# Patient Record
Sex: Female | Born: 1990 | Race: White | Hispanic: No | Smoking: Current every day smoker
Health system: Southern US, Community
[De-identification: ages and names within clinical notes are randomized; demographics above are authoritative.]

## PROBLEM LIST (undated history)

## (undated) DIAGNOSIS — F419 Anxiety disorder, unspecified: Secondary | ICD-10-CM

## (undated) DIAGNOSIS — F319 Bipolar disorder, unspecified: Secondary | ICD-10-CM

## (undated) DIAGNOSIS — F909 Attention-deficit hyperactivity disorder, unspecified type: Secondary | ICD-10-CM

## (undated) DIAGNOSIS — F988 Other specified behavioral and emotional disorders with onset usually occurring in childhood and adolescence: Secondary | ICD-10-CM

## (undated) DIAGNOSIS — G43909 Migraine, unspecified, not intractable, without status migrainosus: Secondary | ICD-10-CM

## (undated) HISTORY — DX: Bipolar disorder, unspecified: F31.9

## (undated) HISTORY — DX: Other specified behavioral and emotional disorders with onset usually occurring in childhood and adolescence: F98.8

## (undated) HISTORY — PX: TUBAL LIGATION: SHX77

## (undated) HISTORY — DX: Migraine, unspecified, not intractable, without status migrainosus: G43.909

## (undated) HISTORY — DX: Attention-deficit hyperactivity disorder, unspecified type: F90.9

## (undated) HISTORY — DX: Anxiety disorder, unspecified: F41.9

## (undated) HISTORY — PX: OTHER SURGICAL HISTORY: SHX169

## (undated) HISTORY — PX: NASAL FRACTURE SURGERY: SHX718

---
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
## 2009-12-03 ENCOUNTER — Inpatient Hospital Stay (HOSPITAL_COMMUNITY): Admission: AD | Admit: 2009-12-03 | Discharge: 2009-12-03 | Payer: Self-pay | Admitting: Family Medicine

## 2009-12-03 ENCOUNTER — Ambulatory Visit: Payer: Self-pay | Admitting: Obstetrics and Gynecology
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
## 2018-06-17 ENCOUNTER — Telehealth: Payer: Self-pay | Admitting: Neurology

## 2018-06-17 ENCOUNTER — Encounter: Payer: Self-pay | Admitting: Neurology

## 2018-06-17 ENCOUNTER — Ambulatory Visit (INDEPENDENT_AMBULATORY_CARE_PROVIDER_SITE_OTHER): Payer: Self-pay | Admitting: Neurology

## 2018-06-17 VITALS — BP 106/71 | HR 96 | Ht 64.0 in | Wt 148.0 lb

## 2018-06-17 DIAGNOSIS — G2581 Restless legs syndrome: Secondary | ICD-10-CM

## 2018-06-17 DIAGNOSIS — Z79899 Other long term (current) drug therapy: Secondary | ICD-10-CM

## 2018-06-17 DIAGNOSIS — G43711 Chronic migraine without aura, intractable, with status migrainosus: Secondary | ICD-10-CM

## 2018-06-17 NOTE — Patient Instructions (Signed)
Erenumab: Patient drug information [employer] here. Copyright [phone] [employer] All rights reserved. (For additional information see "Erenumab: Drug information") Brand Names: [place]  Aimovig  Brand Names: [place]  Aimovig  What is this drug used for?   It is used to prevent migraine headaches.  What do I need to tell my doctor BEFORE I take this drug?   If you have an allergy to this drug or any part of this drug.   If you are allergic to any drugs like this one, any other drugs, foods, or other substances. Tell your doctor about the allergy and what signs you had, like rash; hives; itching; shortness of breath; wheezing; cough; swelling of face, lips, tongue, or throat; or any other signs.   This drug may interact with other drugs or health problems.   Tell your doctor and pharmacist about all of your drugs (prescription or OTC, natural products, vitamins) and health problems. You must check to make sure that it is safe for you to take this drug with all of your drugs and health problems. Do not start, stop, or change the dose of any drug without checking with your doctor.  What are some things I need to know or do while I take this drug?   Tell all of your health care providers that you take this drug. This includes your doctors, nurses, pharmacists, and dentists.   If you have a latex allergy, talk with your doctor.   Tell your doctor if you are pregnant or plan on getting pregnant. You will need to talk about the benefits and risks of using this drug while you are pregnant.   Tell your doctor if you are breast-feeding. You will need to talk about any risks to your baby.  What are some side effects that I need to call my doctor about right away?   WARNING/CAUTION: Even though it may be rare, some people may have very bad and sometimes deadly side effects when taking a drug. Tell your doctor or get medical help right away if you have any of the following signs or symptoms  that may be related to a very bad side effect:   Signs of an allergic reaction, like rash; hives; itching; red, swollen, blistered, or peeling skin with or without fever; wheezing; tightness in the chest or throat; trouble breathing, swallowing, or talking; unusual hoarseness; or swelling of the mouth, face, lips, tongue, or throat.  What are some other side effects of this drug?   All drugs may cause side effects. However, many people have no side effects or only have minor side effects. Call your doctor or get medical help if any of these side effects or any other side effects bother you or do not go away:   Redness or swelling where the shot is given.   Pain where the shot was given.   Constipation.   These are not all of the side effects that may occur. If you have questions about side effects, call your doctor. Call your doctor for medical advice about side effects.   You may report side effects to your national health agency.  How is this drug best taken?   Use this drug as ordered by your doctor. Read all information given to you. Follow all instructions closely.   It is given as a shot into the fatty part of the skin on the top of the thigh, belly area, or upper arm.   If you will be giving  yourself the shot, your doctor or nurse will teach you how to give the shot.   Follow how to use as you have been told by the doctor or read the package insert.   If stored in a refrigerator, let this drug come to room temperature before using it. Leave it at room temperature for at least 30 minutes. Do not heat this drug.   Protect from heat and sunlight.   Do not shake.   Do not give into skin that is irritated, bruised, red, infected, or scarred.   Do not use if the solution is cloudy, leaking, or has particles.   Do not use if solution changes color.   Throw away after using. Do not use the device more than 1 time.   Throw away needles in a needle/sharp disposal box. Do not reuse needles or  other items. When the box is full, follow all local rules for getting rid of it. Talk with a doctor or pharmacist if you have any questions.  What do I do if I miss a dose?   Take a missed dose as soon as you think about it.   After taking a missed dose, start a new schedule based on when the dose is taken.  How do I store and/or throw out this drug?   Store in a refrigerator. Do not freeze.   Store in the carton to protect from light.   Do not use if it has been frozen.   If you drop this drug on a hard surface, do not use it.   If needed, you may store at room temperature for up to 7 days. Write down the date you take this drug out of the refrigerator. If stored at room temperature and not used within 7 days, throw this drug away.   Do not put this drug back in the refrigerator after it has been stored at room temperature.   Keep all drugs in a safe place. Keep all drugs out of the reach of children and pets.   Throw away unused or expired drugs. Do not flush down a toilet or pour down a drain unless you are told to do so. Check with your pharmacist if you have questions about the best way to throw out drugs. There may be drug take-back programs in your area.  General drug facts   If your symptoms or health problems do not get better or if they become worse, call your doctor.   Do not share your drugs with others and do not take anyone else's drugs.   Keep a list of all your drugs (prescription, natural products, vitamins, OTC) with you. Give this list to your doctor.   Talk with the doctor before starting any new drug, including prescription or OTC, natural products, or vitamins.   Some drugs may have another patient information leaflet. If you have any questions about this drug, please talk with your doctor, nurse, pharmacist, or other health care provider.   If you think there has been an overdose, call your poison control center or get medical care right away. Be ready to tell or show  what was taken, how much, and when it happened.  OnabotulinumtoxinA injection (Medical Use) What is this medicine? ONABOTULINUMTOXINA (o na BOTT you lye num tox in eh) is a neuro-muscular blocker. This medicine is used to treat crossed eyes, eyelid spasms, severe neck muscle spasms, ankle and toe muscle spasms, and elbow, wrist, and finger muscle spasms. It is  also used to treat excessive underarm sweating, to prevent chronic migraine headaches, and to treat loss of bladder control due to neurologic conditions such as multiple sclerosis or spinal cord injury. This medicine may be used for other purposes; ask your health care provider or pharmacist if you have questions. COMMON BRAND NAME(S): Botox What should I tell my health care provider before I take this medicine? They need to know if you have any of these conditions: -breathing problems -cerebral palsy spasms -difficulty urinating -heart problems -history of surgery where this medicine is going to be used -infection at the site where this medicine is going to be used -myasthenia gravis or other neurologic disease -nerve or muscle disease -surgery plans -take medicines that treat or prevent blood clots -thyroid problems -an unusual or allergic reaction to botulinum toxin, albumin, other medicines, foods, dyes, or preservatives -pregnant or trying to get pregnant -breast-feeding How should I use this medicine? This medicine is for injection into a muscle. It is given by a health care professional in a hospital or clinic setting. Talk to your pediatrician regarding the use of this medicine in children. While this drug may be prescribed for children as young as 29 years old for selected conditions, precautions do apply. Overdosage: If you think you have taken too much of this medicine contact a poison control center or emergency room at once. NOTE: This medicine is only for you. Do not share this medicine with others. What if I miss a  dose? This does not apply. What may interact with this medicine? -aminoglycoside antibiotics like gentamicin, neomycin, tobramycin -muscle relaxants -other botulinum toxin injections This list may not describe all possible interactions. Give your health care provider a list of all the medicines, herbs, non-prescription drugs, or dietary supplements you use. Also tell them if you smoke, drink alcohol, or use illegal drugs. Some items may interact with your medicine. What should I watch for while using this medicine? Visit your doctor for regular check ups. This medicine will cause weakness in the muscle where it is injected. Tell your doctor if you feel unusually weak in other muscles. Get medical help right away if you have problems with breathing, swallowing, or talking. This medicine might make your eyelids droop or make you see blurry or double. If you have weak muscles or trouble seeing do not drive a car, use machinery, or do other dangerous activities. This medicine contains albumin from human blood. It may be possible to pass an infection in this medicine, but no cases have been reported. Talk to your doctor about the risks and benefits of this medicine. If your activities have been limited by your condition, go back to your regular routine slowly after treatment with this medicine. What side effects may I notice from receiving this medicine? Side effects that you should report to your doctor or health care professional as soon as possible: -allergic reactions like skin rash, itching or hives, swelling of the face, lips, or tongue -breathing problems -changes in vision -chest pain or tightness -eye irritation, pain -fast, irregular heartbeat -infection -numbness -speech problems -swallowing problems -unusual weakness Side effects that usually do not require medical attention (report to your doctor or health care professional if they continue or are bothersome): -bruising or pain at  site where injected -drooping eyelid -dry eyes or mouth -headache -muscles aches, pains -sensitivity to light -tearing This list may not describe all possible side effects. Call your doctor for medical advice about side effects. You may report  side effects to FDA at 1-800-FDA-1088. Where should I keep my medicine? This drug is given in a hospital or clinic and will not be stored at home. NOTE: This sheet is a summary. It may not cover all possible information. If you have questions about this medicine, talk to your doctor, pharmacist, or health care provider.  2018 Elsevier/Gold Standard (2014-12-07 15:43:53)

## 2018-06-17 NOTE — Progress Notes (Addendum)
[facility]    Provider:  Dr Lucia Gaskins Referring Provider: Gwenlyn Found, MD Primary Care Physician:  Gwenlyn Found, MD  CC:  Migraines  Addendum 06/19/2018: Reviewed results with patient, her carbamazepine levels are bascially 0 and lamotrigine undetected. She is taking her Gabapentin already. Suspect noncompliance but she says her "body is not digesting it".   Her ferritin was 50 and we like to see it >75 for RLS so I recommend taking iron supplement 325mg  twice daily with vitamin C. Faxing to Dr. Rene Kocher who manages these medications. Discussed noncompliance but patient says she says she does take them. Discussed breakthrough seizures and if she doesn't take meds they won't help.   HPI:  Kimberly Barry is a 27 y.o. female here as a referral from Dr. Rene Kocher for migraines.  She has a past medical history of migraines, seizure history (?)  And bipolar disorder and COPD, OCD, ADHD.  Her seizure history is questionable she had one episode of generalized tonic-clonic seizure several years back.  She is on multiple antiseizure medications but it appears as though these are for pain and bipolar disorder not for seizures. Here with mother. She had tubal ligation.   She has migraines since she was a kid. Mother has migraines. Behind the eyes, pulsating, pounding, throbbing, unilateral and then spreads, nausea, vomiting, +photo/phonosensitivity, movement makes it worse. Worsened by stress. She has daily headaches, wake up with them, worse positionally, she has diplopia and vision loss so much blurry vision she can;t see. The migraines can be severe, daily migraines that last up to 24 hours. No other focal neurologic deficits, associated symptoms, inciting events or modifiable factors. No Aura. No medication overuse.   Migraine medications tried: Metoprolol, Lamictal, Depakote, Topamax, amitriptyline, Tegretol, olanzapine, Seroquel, gabapentin, Cymbalta, trazodone  Reviewed notes, labs  and imaging from outside physicians, which showed:  Patient has history of migraines, has been on triptans has been on Toprol-XL 100 mg once daily recently decreased to 50 mg due to affecting blood pressure and recently had a syncopal event due to hypotension she is also had trials of Topamax Depakote and amitriptyline.   She also has history of bipolar disorder on Lamictal, Tegretol and Xanax she has also been on olanzapine, Depakote and Seroquel at certain points.  Review of Systems: Patient complains of symptoms per HPI as well as the following symptoms headache, numbness, adhd, bipolar, anxiety, joint pain, aching muscles. Pertinent negatives and positives per HPI. All others negative.   Social History   Socioeconomic History  . Marital status: Divorced    Spouse name: Not on file  . Number of children: 3  . Years of education: Not on file  . Highest education level: Not on file  Occupational History  . Not on file  Social Needs  . Financial resource strain: Not on file  . Food insecurity:    Worry: Not on file    Inability: Not on file  . Transportation needs:    Medical: Not on file    Non-medical: Not on file  Tobacco Use  . Smoking status: Current Every Day Smoker    Packs/day: 1.00    Types: Cigarettes  . Smokeless tobacco: Former Engineer, water and Sexual Activity  . Alcohol use: Never    Frequency: Never  . Drug use: Never  . Sexual activity: Not on file  Lifestyle  . Physical activity:    Days per week: Not on file    Minutes per session: Not  on file  . Stress: Not on file  Relationships  . Social connections:    Talks on phone: Not on file    Gets together: Not on file    Attends religious service: Not on file    Active member of club or organization: Not on file    Attends meetings of clubs or organizations: Not on file    Relationship status: Not on file  . Intimate partner violence:    Fear of current or ex partner: Not on file    Emotionally  abused: Not on file    Physically abused: Not on file    Forced sexual activity: Not on file  Other Topics Concern  . Not on file  Social History Narrative   Lives at home with her child    Family History  Problem Relation Age of Onset  . Migraines Mother     Past Medical History:  Diagnosis Date  . ADD (attention deficit disorder)   . ADHD   . Anxiety   . Bipolar 1 disorder (HCC)   . Migraine     Past Surgical History:  Procedure Laterality Date  . CESAREAN SECTION     x3   . lymph nodes removed     inguinal, breast, foot, leg  . NASAL FRACTURE SURGERY    . TUBAL LIGATION      Current Outpatient Medications  Medication Sig Dispense Refill  . albuterol (PROVENTIL HFA;VENTOLIN HFA) 108 (90 Base) MCG/ACT inhaler Inhale 2 puffs into the lungs every 6 (six) hours as needed for wheezing or shortness of breath.    . ALPRAZolam (XANAX) 1 MG tablet Take 1 mg by mouth 3 (three) times daily as needed for anxiety.    . carbamazepine (TEGRETOL XR) 400 MG 12 hr tablet Take 400 mg by mouth 2 (two) times daily.    . diclofenac (VOLTAREN) 75 MG EC tablet Take 75 mg by mouth 2 (two) times daily.    Marland Kitchen dicyclomine (BENTYL) 10 MG capsule Take 10 mg by mouth 4 (four) times daily -  before meals and at bedtime.    . docusate sodium (COLACE) 100 MG capsule Take 100 mg by mouth as needed for mild constipation.    . gabapentin (NEURONTIN) 600 MG tablet Take 600 mg by mouth 3 (three) times daily.    Marland Kitchen lamoTRIgine 200 MG TBDP Take 200 mg by mouth 2 (two) times daily.    . metoprolol succinate (TOPROL-XL) 50 MG 24 hr tablet Take 50 mg by mouth daily. Take with or immediately following a meal.    . naratriptan (AMERGE) 2.5 MG tablet Take 2.5 mg by mouth as needed for migraine. Take one (1) tablet at onset of headache; if returns or does not resolve, may repeat after 4 hours; do not exceed five (5) mg in 24 hours.    Marland Kitchen omeprazole (PRILOSEC) 20 MG capsule Take 20 mg by mouth daily.    Marland Kitchen tiotropium  (SPIRIVA) 18 MCG inhalation capsule Place 18 mcg into inhaler and inhale daily.    Marland Kitchen tiZANidine (ZANAFLEX) 4 MG tablet Take 6 mg by mouth every 8 (eight) hours as needed.     No current facility-administered medications for this visit.     Allergies as of 06/17/2018 - Review Complete 06/17/2018  Allergen Reaction Noted  . Penicillins Hives 06/17/2018  . Trazodone and nefazodone Nausea And Vomiting 06/17/2018  . Ambien [zolpidem] Nausea And Vomiting 06/17/2018  . Duloxetine Rash 06/17/2018  . Lortab [hydrocodone-acetaminophen] Hives, Nausea  And Vomiting, and Rash 06/17/2018  . Tylenol [acetaminophen] Hives and Rash 06/17/2018    Vitals: BP 106/71 (BP Location: Left Arm, Patient Position: Sitting)   Pulse 96   Ht 5\' 4"  (1.626 m)   Wt 148 lb (67.1 kg)   BMI 25.40 kg/m  Last Weight:  Wt Readings from Last 1 Encounters:  06/17/18 148 lb (67.1 kg)   Last Height:   Ht Readings from Last 1 Encounters:  06/17/18 5\' 4"  (1.626 m)    Physical exam: Exam: Gen: NAD, conversant, well nourised, well groomed                     CV: RRR, no MRG. No Carotid Bruits. No peripheral edema, warm, nontender Eyes: Conjunctivae clear without exudates or hemorrhage  Neuro: Detailed Neurologic Exam  Speech:    Speech is normal; fluent and spontaneous with normal comprehension.  Cognition:    The patient is oriented to person, place, and time;     recent and remote memory intact;     language fluent;     normal attention, concentration,     fund of knowledge Cranial Nerves:    The pupils are equal, round, and reactive to light. The fundi are normal and spontaneous venous pulsations are present. Visual fields are full to finger confrontation. Extraocular movements are intact. Trigeminal sensation is intact and the muscles of mastication are normal. The face is symmetric. The palate elevates in the midline. Hearing intact. Voice is normal. Shoulder shrug is normal. The tongue has normal motion  without fasciculations.   Coordination:    Normal finger to nose and heel to shin. Normal rapid alternating movements.   Gait:    Heel-toe and tandem gait are normal.   Motor Observation:    No asymmetry, no atrophy, and no involuntary movements noted. Tone:    Normal muscle tone.    Posture:    Posture is normal. normal erect    Strength:    Strength is V/V in the upper and lower limbs.      Sensation: intact to LT     Reflex Exam:  DTR's:    Deep tendon reflexes in the upper and lower extremities are normal bilaterally.   Toes:    The toes are downgoing bilaterally.   Clonus:    Clonus is absent.      Assessment/Plan:  27 y.o. female here as a referral from Dr. Rene Kocher for migraines.  She has a past medical history of migraines, seizure history (?)  And bipolar disorder and COPD.  Her seizure history is questionable she had one episode of generalized tonic-clonic seizure several years back.  She is on multiple antiseizure medications but it appears as though these are for pain and bipolar disorder not for seizures.  - She is on multiple antiseizure medications but it appears as though these are for pain and bipolar disorder not for seizures. One possible seizure years ago, would not diagnose with seizure disorder and is well convered by her psych meds which are also AEDs.   Patient is currently on significant amount of medications including multiple medications that were used to treat migraines such as Tegretol, gabapentin, Lamictal, metoprolol, tizanidine and appears she is also tried and failed multiple other migraine medications such as Topamax, tricyclic antidepressants, cymbalta, Depakote.  I do not feel another medication would be in her best interest at this time.  I will check levels today though.  Botox for migraines  Ferritin for  RLS  Discuss aimovig in the future if needed **  Addendum 06/19/2018: Reviewed results with patient, her carbamazepine levels are  bascially 0 and lamotrigine undetected. She is taking her Gabapentin already. Suspect noncompliance but she says her "body is not digesting it".   Her ferritin was 50 and we like to see it >75 for RLS so I recommend taking iron supplement 325mg  twice daily with vitamin C. Faxing to Dr. Rene KocherEksir who manages these medications. Discussed noncompliance but patient says she says she does take them. Discussed breakthrough seizures and if she doesn't take meds they won't help.   Cc: Dr. Andres EgeElksir    Kimberly Ahern, MD  [facility] [address] [place] [id number]  Phone [phone] Fax [phone]

## 2018-06-17 NOTE — Telephone Encounter (Signed)
Patient was brought to me today as a new start for Botox. I scheduled an apt and gave her a new patient enrollment form. Enrollment for [facility] and [place] [facility] have been filled out and given to Dr. Lucia GaskinsAhern signature.

## 2018-06-19 ENCOUNTER — Encounter (INDEPENDENT_AMBULATORY_CARE_PROVIDER_SITE_OTHER): Payer: Self-pay

## 2018-06-19 ENCOUNTER — Telehealth: Payer: Self-pay | Admitting: Neurology

## 2018-06-19 ENCOUNTER — Encounter (HOSPITAL_COMMUNITY): Payer: Self-pay | Admitting: Psychiatry

## 2018-06-19 ENCOUNTER — Ambulatory Visit (HOSPITAL_COMMUNITY): Payer: Medicaid Other | Admitting: Psychiatry

## 2018-06-19 ENCOUNTER — Telehealth (HOSPITAL_COMMUNITY): Payer: Self-pay | Admitting: *Deleted

## 2018-06-19 ENCOUNTER — Telehealth (HOSPITAL_COMMUNITY): Payer: Self-pay | Admitting: Psychiatry

## 2018-06-19 VITALS — BP 117/81 | HR 106 | Ht 65.0 in | Wt 143.0 lb

## 2018-06-19 DIAGNOSIS — F3160 Bipolar disorder, current episode mixed, unspecified: Secondary | ICD-10-CM

## 2018-06-19 LAB — CBC
HEMOGLOBIN: 13.7 g/dL (ref 11.1–15.9)
Hematocrit: 41.2 % (ref 34.0–46.6)
MCH: 33 pg (ref 26.6–33.0)
MCHC: 33.3 g/dL (ref 31.5–35.7)
MCV: 99 fL — ABNORMAL HIGH (ref 79–97)
Platelets: 335 10*3/uL (ref 150–450)
RBC: 4.15 x10E6/uL (ref 3.77–5.28)
RDW: 12.8 % (ref 12.3–15.4)
WBC: 8.5 10*3/uL (ref 3.4–10.8)

## 2018-06-19 LAB — COMPREHENSIVE METABOLIC PANEL
ALBUMIN: 4.4 g/dL (ref 3.5–5.5)
ALK PHOS: 64 IU/L (ref 39–117)
ALT: 16 IU/L (ref 0–32)
AST: 14 IU/L (ref 0–40)
Albumin/Globulin Ratio: 2.3 — ABNORMAL HIGH (ref 1.2–2.2)
BILIRUBIN TOTAL: 0.3 mg/dL (ref 0.0–1.2)
BUN / CREAT RATIO: 13 (ref 9–23)
BUN: 10 mg/dL (ref 6–20)
CHLORIDE: 102 mmol/L (ref 96–106)
CO2: 26 mmol/L (ref 20–29)
Calcium: 9.4 mg/dL (ref 8.7–10.2)
Creatinine, Ser: 0.77 mg/dL (ref 0.57–1.00)
GFR calc Af Amer: 121 mL/min/{1.73_m2} (ref >59)
GFR calc non Af Amer: 105 mL/min/{1.73_m2} (ref >59)
GLUCOSE: 78 mg/dL (ref 65–99)
Globulin, Total: 1.9 g/dL (ref 1.5–4.5)
POTASSIUM: 3.8 mmol/L (ref 3.5–5.2)
SODIUM: 142 mmol/L (ref 134–144)
Total Protein: 6.3 g/dL (ref 6.0–8.5)

## 2018-06-19 LAB — CARBAMAZEPINE LEVEL, TOTAL: CARBAMAZEPINE LVL: 0.6 ug/mL — AB (ref 4.0–12.0)

## 2018-06-19 LAB — LAMOTRIGINE LEVEL: LAMOTRIGINE LVL: NOT DETECTED ug/mL (ref 2.0–20.0)

## 2018-06-19 LAB — GABAPENTIN LEVEL: Gabapentin Lvl: 16.3 ug/mL — ABNORMAL HIGH (ref 4.0–16.0)

## 2018-06-19 LAB — FERRITIN: Ferritin: 48 ng/mL (ref 15–150)

## 2018-06-19 NOTE — Patient Instructions (Signed)
1. Continue carbamazepine 400 mg twice a day  2. Continue lamotrigine 200 mg twice a day  3. Continue Xanax 1 mg TID 4. Return to clinic in one month for 30 mins 5. Obtain urine drug test

## 2018-06-19 NOTE — Telephone Encounter (Signed)
Correction to the Kimberly Barry note. Medication was not filled this time as the patient declined refill (she reported that she has enough medication from her PCP).

## 2018-06-19 NOTE — Telephone Encounter (Signed)
Dr Vanetta ShawlHisada told me Kimberly Barry needed to have a UDS. I went to find Kimberly Barry in lobby to bring back for UDS.   And lobby was empty. I called Kimberly Barry she said she's not coming back. That the doctor made her feel uncomfortable. I informed that a prescription was sent in for 30 day supply but without a UDS there will be no more refills per provider

## 2018-06-19 NOTE — Telephone Encounter (Signed)
Received a receipt of confirmation from faxed results.

## 2018-06-19 NOTE — Telephone Encounter (Signed)
Reviewed results with patient, her carbamazepine levels are bascially 0 and lamotrigine undetected. She is taking her Gabapentin already. Suspect noncompliance but she says her "body is not digesting it".   Her ferritin was 50 and we like to see it >75 for RLS so I recommend taking iron supplement 325mg  twice daily with vitamin C. Faxing to Dr. Rene KocherEksir who manages these medications.

## 2018-07-09 NOTE — Telephone Encounter (Addendum)
Do you know which drug classes they are looking for? She has tried several meds that are from multiple drug classes such as anti-epileptics, SSRIs, tricyclic antidepressant, Beta blocker.  Migraine medications tried: Metoprolol, Lamictal, Depakote, Topamax, amitriptyline (tricyclic antidepressant), Tegretol, olanzapine, Seroquel, gabapentin, Cymbalta, trazodone.

## 2018-07-09 NOTE — Telephone Encounter (Addendum)
Patient was denied but after consulting with Consuela the patient did in fact meet the drug requirements. She is going to fix the authorization and approve within the hour.  Ref#[id number]

## 2018-07-09 NOTE — Telephone Encounter (Signed)
Noted, thank you

## 2018-07-09 NOTE — Telephone Encounter (Signed)
[facility] and we corrected it.   I called [facility] to check status of delivery and they stated they did not have insurance on file. I gave her this information for the second time.

## 2018-07-15 NOTE — Telephone Encounter (Signed)
The patients medication is ready to ship. I called the patient to make her aware but she did not answer so I left her a VM asking her to call me back.

## 2018-07-16 NOTE — Telephone Encounter (Signed)
I called and spoke with the patient, she took the number and stated that she would call them today.

## 2018-07-18 ENCOUNTER — Ambulatory Visit: Payer: Medicaid Other | Admitting: Neurology

## 2018-07-18 DIAGNOSIS — G43711 Chronic migraine without aura, intractable, with status migrainosus: Secondary | ICD-10-CM | POA: Diagnosis not present

## 2018-07-18 MED ORDER — ERENUMAB-AOOE 140 MG/ML ~~LOC~~ SOAJ: 140.0000 mg | SUBCUTANEOUS | 11 refills | Status: AC

## 2018-07-18 NOTE — Progress Notes (Signed)
Botox-100unitsx2 vials Lot: [id number] Expiration: 01/2021 NDC: 3704-8889-16 [id number]  0.9% Sodium Chloride- 69mL total Lot: [id number] Expiration: 08/2019 NDC: 7915-0569-79  Dx: G43.711 S/P

## 2018-07-18 NOTE — Progress Notes (Signed)
Consent Form Botulism Toxin Injection For Chronic Migraine  +masseters, +temples. No eyes or LS but consider next time  Reviewed orally with patient, additionally signature is on file:  Botulism toxin has been approved by the Federal drug administration for treatment of chronic migraine. Botulism toxin does not cure chronic migraine and it may not be effective in some patients.  The administration of botulism toxin is accomplished by injecting a small amount of toxin into the muscles of the neck and head. Dosage must be titrated for each individual. Any benefits resulting from botulism toxin tend to wear off after 3 months with a repeat injection required if benefit is to be maintained. Injections are usually done every 3-4 months with maximum effect peak achieved by about 2 or 3 weeks. Botulism toxin is expensive and you should be sure of what costs you will incur resulting from the injection.  The side effects of botulism toxin use for chronic migraine may include:   -Transient, and usually mild, facial weakness with facial injections  -Transient, and usually mild, head or neck weakness with head/neck injections  -Reduction or loss of forehead facial animation due to forehead muscle weakness  -Eyelid drooping  -Dry eye  -Pain at the site of injection or bruising at the site of injection  -Double vision  -Potential unknown long term risks  Contraindications: You should not have Botox if you are pregnant, nursing, allergic to albumin, have an infection, skin condition, or muscle weakness at the site of the injection, or have myasthenia gravis, Lambert-Eaton syndrome, or ALS.  It is also possible that as with any injection, there may be an allergic reaction or no effect from the medication. Reduced effectiveness after repeated injections is sometimes seen and rarely infection at the injection site may occur. All care will be taken to prevent these side effects. If therapy is given over a  long time, atrophy and wasting in the muscle injected may occur. Occasionally the patient's become refractory to treatment because they develop antibodies to the toxin. In this event, therapy needs to be modified.  I have read the above information and consent to the administration of botulism toxin.    BOTOX PROCEDURE NOTE FOR MIGRAINE HEADACHE    Contraindications and precautions discussed with patient(above). Aseptic procedure was observed and patient tolerated procedure. Procedure performed by Dr. Artemio Aly  The condition has existed for more than 6 months, and pt does not have a diagnosis of ALS, Myasthenia Gravis or Lambert-Eaton Syndrome.  Risks and benefits of injections discussed and pt agrees to proceed with the procedure.  Written consent obtained  These injections are medically necessary. Pt  receives good benefits from these injections. These injections do not cause sedations or hallucinations which the oral therapies may cause.  Indication/Diagnosis: chronic migraine BOTOX(J0585) injection was performed according to protocol by [employer]. 200 units of BOTOX was dissolved into 4 cc NS.   NDC: [address]-44   Description of procedure:  The patient was placed in a sitting position. The standard protocol was used for Botox as follows, with 5 units of Botox injected at each site:   -Procerus muscle, midline injection  -Corrugator muscle, bilateral injection  -Frontalis muscle, bilateral injection, with 2 sites each side, medial injection was performed in the upper one third of the frontalis muscle, in the region vertical from the medial inferior edge of the superior orbital rim. The lateral injection was again in the upper one third of the forehead vertically above the lateral limbus  of the cornea, 1.5 cm lateral to the medial injection site.  -Temporalis muscle injection, 4 sites, bilaterally. The first injection was 3 cm above the tragus of the ear, second injection site  was 1.5 cm to 3 cm up from the first injection site in line with the tragus of the ear. The third injection site was 1.5-3 cm forward between the first 2 injection sites. The fourth injection site was 1.5 cm posterior to the second injection site.  -Occipitalis muscle injection, 3 sites, bilaterally. The first injection was done one half way between the occipital protuberance and the tip of the mastoid process behind the ear. The second injection site was done lateral and superior to the first, 1 fingerbreadth from the first injection. The third injection site was 1 fingerbreadth superiorly and medially from the first injection site.  -Cervical paraspinal muscle injection, 2 sites, bilateral knee first injection site was 1 cm from the midline of the cervical spine, 3 cm inferior to the lower border of the occipital protuberance. The second injection site was 1.5 cm superiorly and laterally to the first injection site.  -Trapezius muscle injection was performed at 3 sites, bilaterally. The first injection site was in the upper trapezius muscle halfway between the inflection point of the neck, and the acromion. The second injection site was one half way between the acromion and the first injection site. The third injection was done between the first injection site and the inflection point of the neck.   Will return for repeat injection in 3 months.   A 200 unit sof Botox was used, 155 units were injected, the rest of the Botox was wasted. The patient tolerated the procedure well, there were no complications of the above procedure.

## 2018-09-25 ENCOUNTER — Telehealth: Payer: Self-pay | Admitting: Neurology

## 2018-09-25 NOTE — Telephone Encounter (Signed)
Botox letter regarding [facility] mailed to patient °

## 2018-10-17 ENCOUNTER — Telehealth: Payer: Self-pay | Admitting: *Deleted

## 2018-10-17 ENCOUNTER — Ambulatory Visit: Payer: Medicaid Other | Admitting: Neurology

## 2018-10-17 NOTE — Telephone Encounter (Signed)
Pt no showed botox appt on 10/17/2018 @ 10:00.

## 2018-10-20 ENCOUNTER — Encounter: Payer: Self-pay | Admitting: Neurology

## 2020-10-15 ENCOUNTER — Emergency Department (HOSPITAL_COMMUNITY): Payer: Medicaid Other

## 2020-10-15 ENCOUNTER — Other Ambulatory Visit: Payer: Self-pay

## 2020-10-15 ENCOUNTER — Emergency Department (HOSPITAL_COMMUNITY)
Admission: EM | Admit: 2020-10-15 | Discharge: 2020-10-15 | Disposition: A | Discharge status: Home / Self Care | Payer: Medicaid Other | Attending: Emergency Medicine | Admitting: Emergency Medicine

## 2020-10-15 DIAGNOSIS — F17210 Nicotine dependence, cigarettes, uncomplicated: Secondary | ICD-10-CM | POA: Insufficient documentation

## 2020-10-15 DIAGNOSIS — Z79899 Other long term (current) drug therapy: Secondary | ICD-10-CM | POA: Diagnosis not present

## 2020-10-15 DIAGNOSIS — S0101XA Laceration without foreign body of scalp, initial encounter: Secondary | ICD-10-CM | POA: Diagnosis not present

## 2020-10-15 DIAGNOSIS — S20419A Abrasion of unspecified back wall of thorax, initial encounter: Secondary | ICD-10-CM | POA: Diagnosis not present

## 2020-10-15 DIAGNOSIS — Y9389 Activity, other specified: Secondary | ICD-10-CM | POA: Insufficient documentation

## 2020-10-15 DIAGNOSIS — Y999 Unspecified external cause status: Secondary | ICD-10-CM | POA: Diagnosis not present

## 2020-10-15 DIAGNOSIS — S51012A Laceration without foreign body of left elbow, initial encounter: Secondary | ICD-10-CM | POA: Diagnosis not present

## 2020-10-15 DIAGNOSIS — R1084 Generalized abdominal pain: Secondary | ICD-10-CM | POA: Insufficient documentation

## 2020-10-15 DIAGNOSIS — S40212A Abrasion of left shoulder, initial encounter: Secondary | ICD-10-CM | POA: Insufficient documentation

## 2020-10-15 DIAGNOSIS — Y92410 Unspecified street and highway as the place of occurrence of the external cause: Secondary | ICD-10-CM | POA: Diagnosis not present

## 2020-10-15 DIAGNOSIS — S0990XA Unspecified injury of head, initial encounter: Secondary | ICD-10-CM | POA: Diagnosis present

## 2020-10-15 DIAGNOSIS — T07XXXA Unspecified multiple injuries, initial encounter: Secondary | ICD-10-CM

## 2020-10-15 DIAGNOSIS — S90812A Abrasion, left foot, initial encounter: Secondary | ICD-10-CM | POA: Diagnosis not present

## 2020-10-15 LAB — CBC WITH DIFFERENTIAL/PLATELET
Abs Immature Granulocytes: 0.04 10*3/uL (ref 0.00–0.07)
Basophils Absolute: 0.1 10*3/uL (ref 0.0–0.1)
Basophils Relative: 1 %
Eosinophils Absolute: 0.4 10*3/uL (ref 0.0–0.5)
Eosinophils Relative: 4 %
HCT: 37.7 % (ref 36.0–46.0)
Hemoglobin: 12.5 g/dL (ref 12.0–15.0)
Immature Granulocytes: 0 %
Lymphocytes Relative: 23 %
Lymphs Abs: 2.5 10*3/uL (ref 0.7–4.0)
MCH: 32.3 pg (ref 26.0–34.0)
MCHC: 33.2 g/dL (ref 30.0–36.0)
MCV: 97.4 fL (ref 80.0–100.0)
Monocytes Absolute: 0.9 10*3/uL (ref 0.1–1.0)
Monocytes Relative: 8 %
Neutro Abs: 7.2 10*3/uL (ref 1.7–7.7)
Neutrophils Relative %: 64 %
Platelets: 320 10*3/uL (ref 150–400)
RBC: 3.87 MIL/uL (ref 3.87–5.11)
RDW: 12.4 % (ref 11.5–15.5)
WBC: 11.1 10*3/uL — ABNORMAL HIGH (ref 4.0–10.5)
nRBC: 0 % (ref 0.0–0.2)

## 2020-10-15 LAB — COMPREHENSIVE METABOLIC PANEL
ALT: 21 U/L (ref 0–44)
AST: 32 U/L (ref 15–41)
Albumin: 4.2 g/dL (ref 3.5–5.0)
Alkaline Phosphatase: 63 U/L (ref 38–126)
Anion gap: 8 (ref 5–15)
BUN: 17 mg/dL (ref 6–20)
CO2: 26 mmol/L (ref 22–32)
Calcium: 8.6 mg/dL — ABNORMAL LOW (ref 8.9–10.3)
Chloride: 103 mmol/L (ref 98–111)
Creatinine, Ser: 0.99 mg/dL (ref 0.44–1.00)
GFR, Estimated: >60 mL/min (ref >60)
Glucose, Bld: 73 mg/dL (ref 70–99)
Potassium: 3.6 mmol/L (ref 3.5–5.1)
Sodium: 137 mmol/L (ref 135–145)
Total Bilirubin: 0.3 mg/dL (ref 0.3–1.2)
Total Protein: 6.8 g/dL (ref 6.5–8.1)

## 2020-10-15 LAB — I-STAT BETA HCG BLOOD, ED (MC, WL, AP ONLY): I-stat hCG, quantitative: <5 m[IU]/mL (ref <5)

## 2020-10-15 LAB — RAPID URINE DRUG SCREEN, HOSP PERFORMED
Amphetamines: POSITIVE — AB
Barbiturates: NOT DETECTED
Benzodiazepines: NOT DETECTED
Cocaine: POSITIVE — AB
Opiates: POSITIVE — AB
Tetrahydrocannabinol: NOT DETECTED

## 2020-10-15 LAB — ETHANOL: Alcohol, Ethyl (B): <10 mg/dL (ref <10)

## 2020-10-15 MED ORDER — NAPROXEN 250 MG PO TABS: ORAL_TABLET | ORAL | 0 refills | Status: AC

## 2020-10-15 MED ORDER — ONDANSETRON HCL 4 MG/2ML IJ SOLN
4.0000 mg | Freq: Once | INTRAMUSCULAR | Status: AC
  Administered 2020-10-15: 4 mg via INTRAVENOUS
  Filled 2020-10-15: qty 2

## 2020-10-15 MED ORDER — IOHEXOL 300 MG/ML  SOLN
100.0000 mL | Freq: Once | INTRAMUSCULAR | Status: AC | PRN
  Administered 2020-10-15: 100 mL via INTRAVENOUS

## 2020-10-15 MED ORDER — SILVER SULFADIAZINE 1 % EX CREA
TOPICAL_CREAM | Freq: Once | CUTANEOUS | Status: AC
  Filled 2020-10-15: qty 50

## 2020-10-15 MED ORDER — KETOROLAC TROMETHAMINE 30 MG/ML IJ SOLN
30.0000 mg | Freq: Once | INTRAMUSCULAR | Status: AC
  Administered 2020-10-15: 30 mg via INTRAVENOUS
  Filled 2020-10-15: qty 1

## 2020-10-15 MED ORDER — LIDOCAINE-EPINEPHRINE (PF) 1 %-1:200000 IJ SOLN
20.0000 mL | Freq: Once | INTRAMUSCULAR | Status: AC
  Administered 2020-10-15: 20 mL
  Filled 2020-10-15: qty 30

## 2020-10-15 MED ORDER — FENTANYL CITRATE (PF) 100 MCG/2ML IJ SOLN
50.0000 ug | Freq: Once | INTRAMUSCULAR | Status: AC
  Administered 2020-10-15: 50 ug via INTRAVENOUS
  Filled 2020-10-15: qty 2

## 2020-10-15 MED ORDER — POVIDONE-IODINE 10 % EX SOLN
CUTANEOUS | Status: AC
  Administered 2020-10-15: 1
  Filled 2020-10-15: qty 15

## 2020-10-15 MED ORDER — CYCLOBENZAPRINE HCL 5 MG PO TABS: 5.0000 mg | ORAL_TABLET | Freq: Three times a day (TID) | ORAL | 0 refills | Status: AC | PRN

## 2020-10-15 NOTE — ED Notes (Signed)
Wound laceration on posterior head irrigated and debrided. Pressure applied and bleeding controled at this time.
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
## 2020-10-15 NOTE — ED Notes (Addendum)
States she has had 13 seizures today  Supposed to be on meds but isn't

## 2020-10-15 NOTE — ED Notes (Signed)
Patient transported to CT 

## 2020-10-15 NOTE — Discharge Instructions (Addendum)
Keep the wounds clean and dry.  You wash your wounds daily with soap and water.  You can use over-the-counter triple antibiotic ointment on all the scrapes until they are healed.  The staples need to be removed in 1 week.  The sutures in your elbow need to be removed in 12 to 14 days.  Recheck sooner for any signs of infection.  Take the medications as prescribed.  You should have your primary care doctor recheck your abrasions early this coming week to make sure they are not getting infected.  Return to the emergency department for any problems listed on the head injury sheet.
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
